# Patient Record
Sex: Female | Born: 1940 | Race: White | Hispanic: No | Marital: Married
Health system: Southern US, Community
[De-identification: ages and names within clinical notes are randomized; demographics above are authoritative.]

---

## 2007-12-25 ENCOUNTER — Emergency Department: Payer: Self-pay | Admitting: Emergency Medicine

## 2015-06-02 ENCOUNTER — Telehealth: Payer: Self-pay | Admitting: *Deleted

## 2015-06-02 NOTE — Telephone Encounter (Signed)
Opened in error

## 2015-08-10 ENCOUNTER — Other Ambulatory Visit: Payer: Self-pay | Admitting: *Deleted

## 2015-08-10 DIAGNOSIS — R748 Abnormal levels of other serum enzymes: Secondary | ICD-10-CM

## 2021-04-01 ENCOUNTER — Emergency Department
Admission: EM | Admit: 2021-04-01 | Discharge: 2021-04-01 | Disposition: A | Discharge status: Home / Self Care | Payer: Medicare HMO | Attending: Emergency Medicine | Admitting: Emergency Medicine

## 2021-04-01 ENCOUNTER — Emergency Department: Payer: Medicare HMO

## 2021-04-01 ENCOUNTER — Other Ambulatory Visit: Payer: Self-pay

## 2021-04-01 DIAGNOSIS — Z131 Encounter for screening for diabetes mellitus: Secondary | ICD-10-CM | POA: Insufficient documentation

## 2021-04-01 DIAGNOSIS — Z7982 Long term (current) use of aspirin: Secondary | ICD-10-CM | POA: Diagnosis not present

## 2021-04-01 DIAGNOSIS — D649 Anemia, unspecified: Secondary | ICD-10-CM | POA: Insufficient documentation

## 2021-04-01 DIAGNOSIS — Z20822 Contact with and (suspected) exposure to covid-19: Secondary | ICD-10-CM | POA: Diagnosis not present

## 2021-04-01 DIAGNOSIS — Z79899 Other long term (current) drug therapy: Secondary | ICD-10-CM | POA: Diagnosis not present

## 2021-04-01 DIAGNOSIS — I10 Essential (primary) hypertension: Secondary | ICD-10-CM | POA: Diagnosis not present

## 2021-04-01 DIAGNOSIS — R519 Headache, unspecified: Secondary | ICD-10-CM | POA: Diagnosis present

## 2021-04-01 DIAGNOSIS — D509 Iron deficiency anemia, unspecified: Secondary | ICD-10-CM | POA: Diagnosis not present

## 2021-04-01 LAB — COMPREHENSIVE METABOLIC PANEL
ALT: 11 U/L (ref 0–44)
AST: 23 U/L (ref 15–41)
Albumin: 3.8 g/dL (ref 3.5–5.0)
Alkaline Phosphatase: 51 U/L (ref 38–126)
Anion gap: 5 (ref 5–15)
BUN: 11 mg/dL (ref 8–23)
CO2: 32 mmol/L (ref 22–32)
Calcium: 9.3 mg/dL (ref 8.9–10.3)
Chloride: 102 mmol/L (ref 98–111)
Creatinine, Ser: 0.8 mg/dL (ref 0.44–1.00)
GFR, Estimated: >60 mL/min (ref >60)
Glucose, Bld: 100 mg/dL — ABNORMAL HIGH (ref 70–99)
Potassium: 3.9 mmol/L (ref 3.5–5.1)
Sodium: 139 mmol/L (ref 135–145)
Total Bilirubin: 0.5 mg/dL (ref 0.3–1.2)
Total Protein: 7.6 g/dL (ref 6.5–8.1)

## 2021-04-01 LAB — DIFFERENTIAL
Abs Immature Granulocytes: 0.01 10*3/uL (ref 0.00–0.07)
Basophils Absolute: 0.1 10*3/uL (ref 0.0–0.1)
Basophils Relative: 1 %
Eosinophils Absolute: 0.2 10*3/uL (ref 0.0–0.5)
Eosinophils Relative: 4 %
Immature Granulocytes: 0 %
Lymphocytes Relative: 40 %
Lymphs Abs: 2.3 10*3/uL (ref 0.7–4.0)
Monocytes Absolute: 0.6 10*3/uL (ref 0.1–1.0)
Monocytes Relative: 11 %
Neutro Abs: 2.5 10*3/uL (ref 1.7–7.7)
Neutrophils Relative %: 44 %

## 2021-04-01 LAB — IRON AND TIBC
Iron: 21 ug/dL — ABNORMAL LOW (ref 28–170)
Saturation Ratios: 4 % — ABNORMAL LOW (ref 10.4–31.8)
TIBC: 476 ug/dL — ABNORMAL HIGH (ref 250–450)
UIBC: 455 ug/dL

## 2021-04-01 LAB — CBC
HCT: 32.3 % — ABNORMAL LOW (ref 36.0–46.0)
Hemoglobin: 9.6 g/dL — ABNORMAL LOW (ref 12.0–15.0)
MCH: 24.7 pg — ABNORMAL LOW (ref 26.0–34.0)
MCHC: 29.7 g/dL — ABNORMAL LOW (ref 30.0–36.0)
MCV: 83.2 fL (ref 80.0–100.0)
Platelets: 291 10*3/uL (ref 150–400)
RBC: 3.88 MIL/uL (ref 3.87–5.11)
RDW: 16.5 % — ABNORMAL HIGH (ref 11.5–15.5)
WBC: 5.7 10*3/uL (ref 4.0–10.5)
nRBC: 0 % (ref 0.0–0.2)

## 2021-04-01 LAB — APTT: aPTT: 31 seconds (ref 24–36)

## 2021-04-01 LAB — CBG MONITORING, ED: Glucose-Capillary: 87 mg/dL (ref 70–99)

## 2021-04-01 LAB — RESP PANEL BY RT-PCR (FLU A&B, COVID) ARPGX2
Influenza A by PCR: NEGATIVE
Influenza B by PCR: NEGATIVE
SARS Coronavirus 2 by RT PCR: NEGATIVE

## 2021-04-01 LAB — ETHANOL: Alcohol, Ethyl (B): <10 mg/dL (ref <10)

## 2021-04-01 LAB — PROTIME-INR
INR: 0.9 (ref 0.8–1.2)
Prothrombin Time: 12.6 seconds (ref 11.4–15.2)

## 2021-04-01 MED ORDER — AMLODIPINE BESYLATE 5 MG PO TABS: 5.0000 mg | ORAL_TABLET | Freq: Every day | ORAL | 0 refills | Status: AC

## 2021-04-01 MED ORDER — IOHEXOL 350 MG/ML SOLN
75.0000 mL | Freq: Once | INTRAVENOUS | Status: AC | PRN
  Administered 2021-04-01: 75 mL via INTRAVENOUS

## 2021-04-01 NOTE — ED Provider Notes (Addendum)
? ?[facility] ?Provider Note ? ? ? Event Date/Time  ? First MD Initiated Contact with Patient 04/01/21 2010   ?  (approximate) ? ? ?History  ? ?Facial Droop ? ? ?HPI ? ?Gail Silva is a 81 y.o. female without any significant past medical history who presents accompanied by family for evaluation of 2 episodes of left-sided headache that occurred earlier today with a second episode associate with some left-sided facial droop.  First episode occurred around 10 or 11 this morning.  Patient states she had a severe headache and left side of face and head that lasted a few minutes and went away.  The second episode occurred around 7 PM and also lasted only a couple minutes but this time she was with family and they reported that perfusion seemed drooped.  This resolved after a few minutes.  She has not had any subsequent headache or facial droop.  She denies any prior similar episodes.  She has not had any injuries or falls.  No other associated symptoms including right-sided headache, vision changes, vertigo, chest pain, cough, shortness of breath, nausea, vomiting, diarrhea, rash, extremity weakness numbness or tingling, or any other clear associated sick symptoms.  She denies EtOH use tobacco abuse or illicit drug use. ? ?  ? ? ?Physical Exam  ?Triage Vital Signs: ?ED Triage Vitals  ?Enc Vitals Group  ?   BP 04/01/21 2014 (!) 188/71  ?   Pulse Rate 04/01/21 2014 92  ?   Resp 04/01/21 2014 18  ?   Temp 04/01/21 2014 98.2 ?F (36.8 ?C)  ?   Temp Source 04/01/21 2014 Oral  ?   SpO2 04/01/21 2014 98 %  ?   Weight 04/01/21 2017 130 lb (59 kg)  ?   Height 04/01/21 2017 5\' 3"  (1.6 m)  ?   Head Circumference --   ?   Peak Flow --   ?   Pain Score 04/01/21 2012 0  ?   Pain Loc --   ?   Pain Edu? --   ?   Excl. in GC? --   ? ? ?Most recent vital signs: ?Vitals:  ? 04/01/21 2014 04/01/21 2303  ?BP: (!) 188/71 (!) 162/78  ?Pulse: 92 90  ?Resp: 18 18  ?Temp: 98.2 ?F (36.8 ?C)   ?SpO2: 98% 98%   ? ? ?General: Awake, no distress.  ?CV:  Good peripheral perfusion.  2+ radial pulses.  No murmur. ?Resp:  Normal effort.  Clear bilaterally. ?Abd:  No distention.  Soft throughout. ?Other:  Cranial nerves II through XII grossly intact.  No pronator drift.  No finger dysmetria.  Symmetric 5/5 strength of all extremities.  Sensation intact to light touch in all extremities.  Is oriented x4. ? ? ? ? ?ED Results / Procedures / Treatments  ?Labs ?(all labs ordered are listed, but only abnormal results are displayed) ?Labs Reviewed  ?CBC - Abnormal; Notable for the following components:  ?    Result Value  ? Hemoglobin 9.6 (*)   ? HCT 32.3 (*)   ? MCH 24.7 (*)   ? MCHC 29.7 (*)   ? RDW 16.5 (*)   ? All other components within normal limits  ?COMPREHENSIVE METABOLIC PANEL - Abnormal; Notable for the following components:  ? Glucose, Bld 100 (*)   ? All other components within normal limits  ?IRON AND TIBC - Abnormal; Notable for the following components:  ? Iron 21 (*)   ? TIBC  476 (*)   ? Saturation Ratios 4 (*)   ? All other components within normal limits  ?RESP PANEL BY RT-PCR (FLU A&B, COVID) ARPGX2  ?ETHANOL  ?PROTIME-INR  ?APTT  ?DIFFERENTIAL  ?URINE DRUG SCREEN, QUALITATIVE ([facility] ONLY)  ?URINALYSIS, ROUTINE W REFLEX MICROSCOPIC  ?CBG MONITORING, ED  ? ? ? ?EKG ? ?ECG is remarkable for sinus tachycardia with ventricular rate of 101, normal axis, unremarkable intervals without evidence of acute ischemia or significant arrhythmia. ? ? ?RADIOLOGY ?CTA head and neck reviewed by myself without evidence of hemorrhage, mass effect, edema or clear large vessel occlusion.  I also reviewed radiologist interpretation and agree with their findings of no acute process including no large vessel occlusion or stenosis.  I agree with their notation of some atherosclerotic plaque and some tortuosity of the underlying vessels suggesting chronic hypertension. ? ? ?PROCEDURES: ? ?Critical Care performed: No ? ?.1-3 Lead EKG  Interpretation ?Performed by: Gilles Chiquito, MD ?Authorized by: Gilles Chiquito, MD  ? ?  Interpretation: normal   ?  ECG rate assessment: normal   ?  Rhythm: sinus rhythm   ?  Ectopy: none   ?  Conduction: normal   ? ?The patient is on the cardiac monitor to evaluate for evidence of arrhythmia and/or significant heart rate changes. ? ? ?MEDICATIONS ORDERED IN ED: ?Medications  ?iohexol (OMNIPAQUE) 350 MG/ML injection 75 mL (75 mLs Intravenous Contrast Given 04/01/21 2112)  ? ? ? ?IMPRESSION / MDM / ASSESSMENT AND PLAN / ED COURSE  ?I reviewed the triage vital signs and the nursing notes. ?             ?               ? ?Differential diagnosis includes, but is not limited to TIA, emergency causing transient facial droop headache, SAH, metabolic derangements with a lower suspicion for temporal arteritis at this time as she is currently symptom-free without any history of recent jaw claudication no temporal or TMJ area tenderness.  She has no objective focal neurological deficits on arrival and so we will work-up for TIA with CTA head and neck and defer code stroke at this time. ? ?CTA head and neck reviewed by myself without evidence of hemorrhage, mass effect, edema or clear large vessel occlusion.  I also reviewed radiologist interpretation and agree with their findings of no acute process including no large vessel occlusion or stenosis.  I agree with their notation of some atherosclerotic plaque and some tortuosity of the underlying vessels suggesting chronic hypertension. ? ?ECG is remarkable for sinus tachycardia with ventricular rate of 101, normal axis, unremarkable intervals without evidence of acute ischemia or significant arrhythmia. ? ?CMP without significant electrolyte or metabolic derangements.  CBC without leukocytosis and hemoglobin 9.6 without recent to compare to.  Normal platelets.  INR and PTT are unremarkable.  Serum ethanol undetectable.  Iron panel shows findings consistent of iron  deficiency anemia.  COVID influenza PCR negative. ? ?Discussed with patient findings on CT and labs and concern for TIA with recommendation to be admitted for MRI as well as TTE and further restratification and likely interventions to reduce risk of recurrent stroke.  I am also unsure precise etiology of her headache.  Patient is adamant she wishes to leave and states she understands that my recommendation is to stay in that acute high risk for another life-threatening stroke in the next several days.  I discussed that she follow-up with her PCP and neurology.  Discussed that she has anemia and that she will need to have this rechecked as well and followed up.  She is already on daily ASA I will add low-dose amlodipine.  I think she is capacity to make this decision and she was discharged against my advice.  Of note prior to discharge she had a UA sent I advised her that I strongly recommend she stay for the results of this although she does not want any longer advised her she can follow this up with her PCP.  Discussed she can return anytime to the emergency room or if she changes her mind. ? ? ? ? ?FINAL CLINICAL IMPRESSION(S) / ED DIAGNOSES  ? ?Final diagnoses:  ?Nonintractable headache, unspecified chronicity pattern, unspecified headache type  ?Hypertension, unspecified type  ?Anemia, unspecified type  ? ? ? ?Rx / DC Orders  ? ?ED Discharge Orders   ? ?      Ordered  ?  amLODipine (NORVASC) 5 MG tablet  Daily       ? 04/01/21 2311  ? ?  ?  ? ?  ? ? ? ?Note:  This document was prepared using Dragon voice recognition software and may include unintentional dictation errors. ?  ?Gilles Chiquito, MD ?04/01/21 2322 ? ?  ?Gilles Chiquito, MD ?04/01/21 2323 ? ?

## 2021-04-01 NOTE — ED Triage Notes (Signed)
Family reports sudden onset pain to left side of head apx 1130 this morning. At apx 7pm tonight facial droop to left side noted by family. Pt alert and oriented in triage. Pt ambulatory to room 1, Dr Earnest Conroy. Smith at bedside at this time for further eval.  ?

## 2021-04-01 NOTE — Discharge Instructions (Addendum)
Your CT today showed: ?IMPRESSION: ?1. Negative CTA of the head and neck. No large vessel occlusion or ?other emergent finding. ?2. Mild for age atheromatous disease. No hemodynamically significant ?or correctable stenosis. ?3. Diffuse tortuosity about the major arterial vasculature of the ?head and neck, suggesting chronic underlying hypertension. ?4. No other acute intracranial abnormality. ?

## 2021-08-31 DIAGNOSIS — I10 Essential (primary) hypertension: Secondary | ICD-10-CM | POA: Diagnosis not present

## 2021-08-31 DIAGNOSIS — D500 Iron deficiency anemia secondary to blood loss (chronic): Secondary | ICD-10-CM | POA: Diagnosis not present

## 2021-08-31 DIAGNOSIS — Z6823 Body mass index (BMI) 23.0-23.9, adult: Secondary | ICD-10-CM | POA: Diagnosis not present

## 2023-06-16 IMAGING — CT CT ANGIO HEAD-NECK (W OR W/O PERF)
1 of 11 series · 5 of 33 positions shown · IV contrast (APPLIED)
Comparison: None.

CLINICAL DATA: Initial evaluation for acute TIA.

EXAM:
CT ANGIOGRAPHY HEAD AND NECK
TECHNIQUE: Multidetector CT imaging of the head and neck was performed using
the standard protocol during bolus administration of intravenous
contrast. Multiplanar CT image reconstructions and MIPs were
obtained to evaluate the vascular anatomy. Carotid stenosis
measurements (when applicable) are obtained utilizing NASCET
criteria, using the distal internal carotid diameter as the
denominator.

[Series 10: ax thin · axial · 0.41mm/px · z∈[-236,-21]mm · 5 of 320 slices shown]
[im 54/320  soft-tissue]
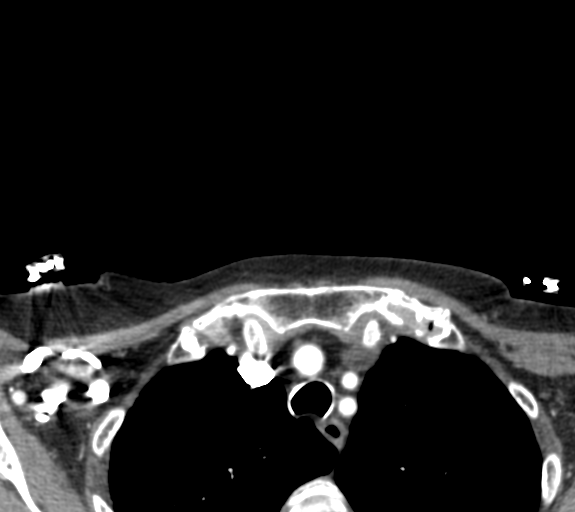
[im 107/320  bone]
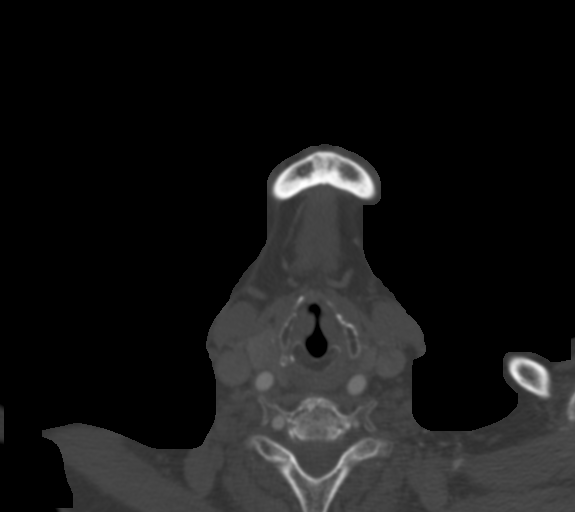
[im 160/320  soft-tissue]
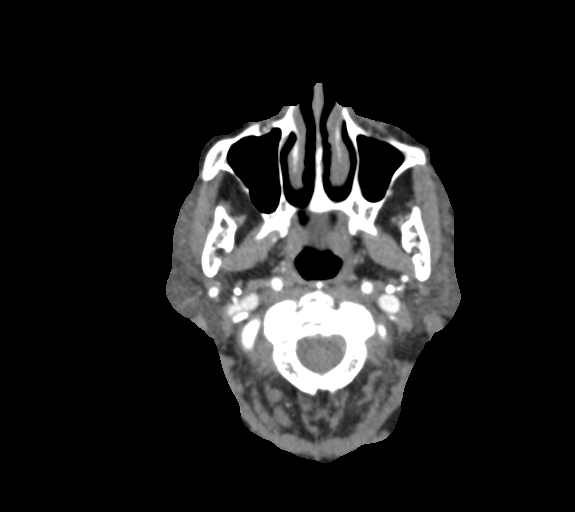
[im 213/320  bone]
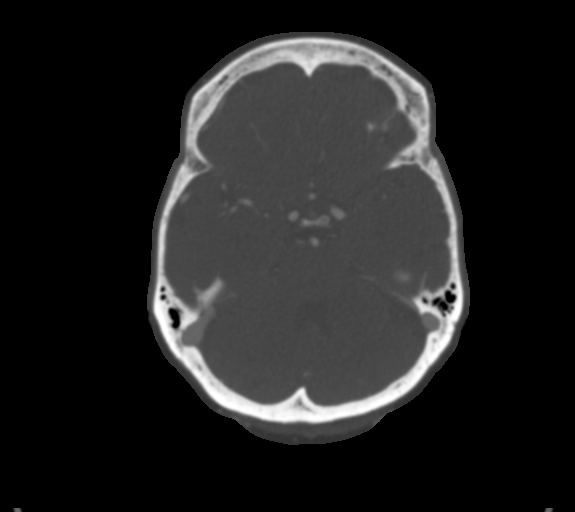
[im 266/320  soft-tissue]
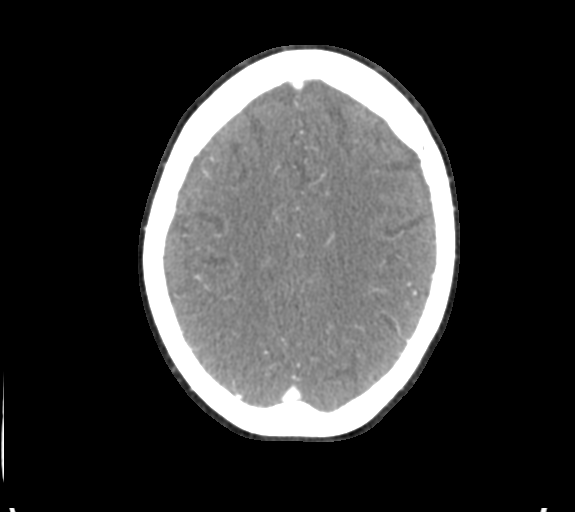

[5 of 33 positions shown; findings below may reference images not displayed]

RADIATION DOSE REDUCTION: This exam was performed according to the
departmental dose-optimization program which includes automated
exposure control, adjustment of the mA and/or kV according to
patient size and/or use of iterative reconstruction technique.

CONTRAST:  75mL OMNIPAQUE IOHEXOL 350 MG/ML SOLN
FINDINGS: CT HEAD FINDINGS

Brain: Cerebral volume within normal limits for patient age.

No evidence for acute intracranial hemorrhage. No findings to
suggest acute large vessel territory infarct. No mass lesion,
midline shift, or mass effect. Ventricles are normal in size without
evidence for hydrocephalus. No extra-axial fluid collection
identified.

Vascular: No hyperdense vessel identified.

Skull: Scalp soft tissues demonstrate no acute abnormality.
Calvarium intact.

Sinuses/Orbits: Globes and orbital soft tissues within normal
limits.

Visualized paranasal sinuses are clear. No mastoid effusion.

CTA NECK FINDINGS

Aortic arch: Visualized aortic arch normal in caliber with normal
branch pattern. Mild atheromatous change about the arch itself. No
stenosis about the origin the great vessels.

Right carotid system: Right common and internal carotid arteries are
tortuous but widely patent without stenosis or dissection. Mild for
age atheromatous change about the right carotid bulb without
stenosis.

Left carotid system: Left common carotid arteries are tortuous but
widely patent without stenosis or dissection. Mild for age
atheromatous change about the left carotid bulb without stenosis.

Vertebral arteries: Both vertebral arteries arise from the
subclavian arteries. No proximal subclavian artery stenosis. Right
vertebral artery dominant. Vertebral arteries tortuous but widely
patent without stenosis or dissection.

Skeleton: No discrete or worrisome osseous lesions. Mild for age
cervical spondylosis. Patient is edentulous.

Other neck: No other acute soft tissue abnormality within the neck.
Note made of a 6 mm right thyroid nodule, felt significance given
size and patient age, no follow-up imaging recommended (ref: [HOSPITAL]. [DATE]): 143-50).

Upper chest: Visualized upper chest demonstrates no acute finding.

Review of the MIP images confirms the above findings

CTA HEAD FINDINGS

Anterior circulation: Petrous segments patent bilaterally. Mild for
age atheromatous change within the carotid siphons without stenosis.
A1 segments patent bilaterally. Normal anterior communicating artery
complex. Anterior cerebral arteries patent bilaterally. Normal in
stenosis or occlusion. No proximal MCA branch occlusion. Distal MCA
branches perfused and symmetric.

Posterior circulation: Dominant right vertebral artery widely
patent. Left vertebral artery largely terminates in PICA, although a
tiny branch ascending towards the vertebrobasilar junction. Both
PICA patent. Basilar patent to its distal aspect without stenosis.
Superior cerebellar and posterior cerebral arteries patent
bilaterally.

Venous sinuses: Patent allowing for timing the contrast bolus.

Anatomic variants: As above.  No aneurysm.

Review of the MIP images confirms the above findings
IMPRESSION: 1. Negative CTA of the head and neck. No large vessel occlusion or
other emergent finding.
2. Mild for age atheromatous disease. No hemodynamically significant
or correctable stenosis.
3. Diffuse tortuosity about the major arterial vasculature of the
head and neck, suggesting chronic underlying hypertension.
4. No other acute intracranial abnormality.
